# Patient Record
Sex: Female | Born: 1995 | Race: White | Hispanic: No | Marital: Single | Smoking: Never smoker
Health system: Southern US, Community
[De-identification: ages and names within clinical notes are randomized; demographics above are authoritative.]

## PROBLEM LIST (undated history)

## (undated) ENCOUNTER — Emergency Department (HOSPITAL_COMMUNITY): Payer: No Typology Code available for payment source | Source: Home / Self Care

## (undated) DIAGNOSIS — F909 Attention-deficit hyperactivity disorder, unspecified type: Secondary | ICD-10-CM

## (undated) DIAGNOSIS — Z68.41 Body mass index (BMI) pediatric, greater than or equal to 95th percentile for age: Secondary | ICD-10-CM

## (undated) DIAGNOSIS — E669 Obesity, unspecified: Secondary | ICD-10-CM

## (undated) HISTORY — DX: Attention-deficit hyperactivity disorder, unspecified type: F90.9

## (undated) HISTORY — PX: WISDOM TOOTH EXTRACTION: SHX21

## (undated) HISTORY — DX: Morbid (severe) obesity due to excess calories: E66.01

## (undated) HISTORY — DX: Obesity, unspecified: E66.9

## (undated) HISTORY — PX: TYMPANOSTOMY TUBE PLACEMENT: SHX32

## (undated) HISTORY — DX: Body mass index (bmi) pediatric, greater than or equal to 95th percentile for age: Z68.54

---
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
## 2016-04-25 ENCOUNTER — Ambulatory Visit: Payer: Self-pay | Admitting: Neurology

## 2016-06-06 ENCOUNTER — Encounter: Payer: Self-pay | Admitting: Neurology

## 2016-06-06 ENCOUNTER — Ambulatory Visit (INDEPENDENT_AMBULATORY_CARE_PROVIDER_SITE_OTHER): Payer: 59 | Admitting: Neurology

## 2016-06-06 DIAGNOSIS — F902 Attention-deficit hyperactivity disorder, combined type: Secondary | ICD-10-CM

## 2016-06-06 DIAGNOSIS — F909 Attention-deficit hyperactivity disorder, unspecified type: Secondary | ICD-10-CM

## 2016-06-06 HISTORY — DX: Attention-deficit hyperactivity disorder, unspecified type: F90.9

## 2016-06-06 MED ORDER — ATOMOXETINE HCL 25 MG PO CAPS: 25.0000 mg | ORAL_CAPSULE | Freq: Every day | ORAL | 0 refills | Status: DC

## 2016-06-06 NOTE — Progress Notes (Signed)
Reason for visit: ADHD  Referring physician: Dr. [name] is a 21 y.o. female  History of present illness:  Leslie Watkins is a 21 year old right-handed white female with a lifelong history of ADHD. The patient has been treated for this issue since she was age 32. The patient was noted to have extreme hyperactivity at that time, and difficulty focusing. She has been on several different medications in the past that include Ritalin, Adderall, and Concerta. The patient was on Concerta for several years. She took the medication when she studied abroad in 2016, but she did not find the medication useful. She will be graduating in December 2018, she is taking a lot of very difficult classes between now and then. She denies any other symptoms such as headache, daytime drowsiness, insomnia, numbness or weakness of the extremities with exception that her right hand may have some numbness at night. The patient denies any balance issues or difficulty controlling the bowels or the bladder. She indicates that her brother also has ADHD. She is sent to this office for an evaluation. The patient denies any underlying depression or anxiety issues.  Past Medical History:  Diagnosis Date  . ADHD   . ADHD (attention deficit hyperactivity disorder) 06/06/2016  . Obesity peds (BMI >=95 percentile)     Past Surgical History:  Procedure Laterality Date  . TYMPANOSTOMY TUBE PLACEMENT    . WISDOM TOOTH EXTRACTION      Family History  Problem Relation Age of Onset  . High blood pressure Brother   . ADD / ADHD Brother   . High blood pressure Maternal Grandmother   . High blood pressure Maternal Grandfather   . High Cholesterol Paternal Grandmother   . High Cholesterol Paternal Grandfather     Social history:  reports that she has been smoking.  She has never used smokeless tobacco. She reports that she drinks about 0.6 oz of alcohol per week . She reports that she does not use  drugs.  Medications:  Prior to Admission medications   Medication Sig Start Date End Date Taking? Authorizing Provider  Ascorbic Acid (VITAMIN C PO) Take by mouth daily.   Yes Historical Provider, MD  BIOTIN PO Take by mouth daily.   Yes Historical Provider, MD  atomoxetine (STRATTERA) 25 MG capsule Take 1 capsule (25 mg total) by mouth daily. 06/06/16   York Spaniel, MD     No Known Allergies  ROS:  Out of a complete 14 system review of symptoms, the patient complains only of the following symptoms, and all other reviewed systems are negative.  Hyperactivity  Blood pressure 128/84, pulse 68, height 5' 5.5" (1.664 m), weight 267 lb (121.1 kg).  Physical Exam  General: The patient is alert and cooperative at the time of the examination. The patient is morbidly obese.  Eyes: Pupils are equal, round, and reactive to light. Discs are flat bilaterally.  Neck: The neck is supple, no carotid bruits are noted.  Respiratory: The respiratory examination is clear.  Cardiovascular: The cardiovascular examination reveals a regular rate and rhythm, no obvious murmurs or rubs are noted.  Skin: Extremities are without significant edema.  Neurologic Exam  Mental status: The patient is alert and oriented x 3 at the time of the examination. The patient has apparent normal recent and remote memory, with an apparently normal attention span and concentration ability.  Cranial nerves: Facial symmetry is present. There is good sensation of the face to pinprick and soft  touch bilaterally. The strength of the facial muscles and the muscles to head turning and shoulder shrug are normal bilaterally. Speech is well enunciated, no aphasia or dysarthria is noted. Extraocular movements are full. Visual fields are full. The tongue is midline, and the patient has symmetric elevation of the soft palate. No obvious hearing deficits are noted.  Motor: The motor testing reveals 5 over 5 strength of all 4  extremities. Good symmetric motor tone is noted throughout.  Sensory: Sensory testing is intact to pinprick, soft touch, vibration sensation, and position sense on all 4 extremities. No evidence of extinction is noted.  Coordination: Cerebellar testing reveals good finger-nose-finger and heel-to-shin bilaterally.  Gait and station: Gait is normal. Tandem gait is normal. Romberg is negative. No drift is seen.  Reflexes: Deep tendon reflexes are symmetric and normal bilaterally. Toes are downgoing bilaterally.   Assessment/Plan:  1. ADHD  The patient will be given a trial on low-dose Strattera, if this is not effective, she is to call for dose adjustments. She will otherwise follow-up in 6 months.   [facility] is [phone].  Marlan Palau. Keith Mendell Bontempo MD 06/06/2016 2:33 PM  [facility] [address] [place], [place] [id number]  Phone [phone] Fax [phone]

## 2016-12-13 ENCOUNTER — Encounter (INDEPENDENT_AMBULATORY_CARE_PROVIDER_SITE_OTHER): Payer: Self-pay

## 2016-12-13 ENCOUNTER — Encounter: Payer: Self-pay | Admitting: Adult Health

## 2016-12-13 ENCOUNTER — Ambulatory Visit (INDEPENDENT_AMBULATORY_CARE_PROVIDER_SITE_OTHER): Payer: 59 | Admitting: Adult Health

## 2016-12-13 VITALS — BP 112/66 | HR 72 | Ht 65.5 in | Wt 277.8 lb

## 2016-12-13 DIAGNOSIS — F902 Attention-deficit hyperactivity disorder, combined type: Secondary | ICD-10-CM | POA: Diagnosis not present

## 2016-12-13 MED ORDER — LISDEXAMFETAMINE DIMESYLATE 20 MG PO CAPS: 20.0000 mg | ORAL_CAPSULE | Freq: Every day | ORAL | 0 refills | Status: AC

## 2016-12-13 NOTE — Progress Notes (Signed)
PATIENT: Leslie Watkins DOB: Apr 14, 1996  REASON FOR VISIT: follow up HISTORY FROM: patient  HISTORY OF PRESENT ILLNESS: Today 12/13/16 Leslie Watkins is a 21 year old female with a history of ADHD. She returns today for follow-up. At the last visit Strattera was started however the patient states that she was unable to tolerate this she reports that it allowed her to focus for approximately 30-40 minutes then it caused extreme drowsiness. She reports that she was unable to stay awake. She states that reason she stopped taking the medication. She reports that she was worried that she will fall asleep during class. She states that her ADHD was controlled without medication until she began college. She states that she has a hard time studying especially for her harder classes. She states this last semester she made decent grades- she reports that she received 2 As, 1 B, and 2 Cs. She states in the past she has gotten the most benefit from stimulants. However she is concerned about the potential for addiction with some of the stimulants. She states that she plans to not use medication once she finishes school.  HISTORY 06/06/16: Leslie Watkins is a 21 year old right-handed white female with a lifelong history of ADHD. The patient has been treated for this issue since she was age 59. The patient was noted to have extreme hyperactivity at that time, and difficulty focusing. She has been on several different medications in the past that include Ritalin, Adderall, and Concerta. The patient was on Concerta for several years. She took the medication when she studied abroad in 2016, but she did not find the medication useful. She will be graduating in December 2018, she is taking a lot of very difficult classes between now and then. She denies any other symptoms such as headache, daytime drowsiness, insomnia, numbness or weakness of the extremities with exception that her right hand may have some numbness at night.  The patient denies any balance issues or difficulty controlling the bowels or the bladder. She indicates that her brother also has ADHD. She is sent to this office for an evaluation. The patient denies any underlying depression or anxiety issues.   REVIEW OF SYSTEMS: Out of a complete 14 system review of symptoms, the patient complains only of the following symptoms, and all other reviewed systems are negative.  See history of present illness  ALLERGIES: Allergies  Allergen Reactions  . No Known Allergies     HOME MEDICATIONS: Outpatient Medications Prior to Visit  Medication Sig Dispense Refill  . Ascorbic Acid (VITAMIN C PO) Take by mouth daily.    Marland Kitchen atomoxetine (STRATTERA) 25 MG capsule Take 1 capsule (25 mg total) by mouth daily. 30 capsule 0  . BIOTIN PO Take by mouth daily.     No facility-administered medications prior to visit.     PAST MEDICAL HISTORY: Past Medical History:  Diagnosis Date  . ADHD   . ADHD (attention deficit hyperactivity disorder) 06/06/2016  . Morbidly obese (HCC)   . Obesity peds (BMI >=95 percentile)     PAST SURGICAL HISTORY: Past Surgical History:  Procedure Laterality Date  . TYMPANOSTOMY TUBE PLACEMENT    . WISDOM TOOTH EXTRACTION      FAMILY HISTORY: Family History  Problem Relation Age of Onset  . High blood pressure Brother   . ADD / ADHD Brother   . High blood pressure Maternal Grandmother   . High blood pressure Maternal Grandfather   . High Cholesterol Paternal Grandmother   . High  Cholesterol Paternal Grandfather     SOCIAL HISTORY: Social History   Social History  . Marital status: Single    Spouse name: N/A  . Number of children: 0  . Years of education: Associate's deg   Occupational History  . [employer]    Social History Main Topics  . Smoking status: Never Smoker  . Smokeless tobacco: Never Used     Comment: Hookah 1-2 x/mo  . Alcohol use 2.4 oz/week    1 Standard drinks or equivalent, 1 Glasses  of wine, 1 Cans of beer, 1 Shots of liquor per week     Comment: 2-3 drinks per month  . Drug use: No  . Sexual activity: Not Currently    Birth control/ protection: IUD   Other Topics Concern  . Not on file   Social History Narrative   Lives 1/2 time w/ family and 1/2 time w/ roommates   Right-handed   Caffeine: 5-7 per week      PHYSICAL EXAM  Vitals:   12/13/16 0720  BP: 112/66  Pulse: 72  Weight: 277 lb 12.8 oz (126 kg)  Height: 5' 5.5" (1.664 m)   Body mass index is 45.53 kg/m.  Generalized: Well developed, in no acute distress   Neurological examination  Mentation: Alert oriented to time, place, history taking. Follows all commands speech and language fluent Cranial nerve II-XII: Pupils were equal round reactive to light. Extraocular movements were full, visual field were full on confrontational test. Facial sensation and strength were normal. Uvula tongue midline. Head turning and shoulder shrug  were normal and symmetric. Motor: The motor testing reveals 5 over 5 strength of all 4 extremities. Good symmetric motor tone is noted throughout.  Sensory: Sensory testing is intact to soft touch on all 4 extremities. No evidence of extinction is noted.  Coordination: Cerebellar testing reveals good finger-nose-finger and heel-to-shin bilaterally.  Gait and station: Gait is normal. Tandem gait is normal. Romberg is negative. No drift is seen.  Reflexes: Deep tendon reflexes are symmetric and normal bilaterally.   DIAGNOSTIC DATA (LABS, IMAGING, TESTING) - I reviewed patient records, labs, notes, testing and imaging myself where available.    ASSESSMENT AND PLAN 21 y.o. year old female  has a past medical history of ADHD; ADHD (attention deficit hyperactivity disorder) (06/06/2016); Morbidly obese (HCC); and Obesity peds (BMI >=95 percentile). here with :  1. ADHD  The patient has tried several medications in the past. She feels that she gets the best benefit with  stimulants. We will try a low-dose Vyvanse. She will take 20 mg daily. I have reviewed side effects with the patient and she was given a handout. She is advised that if she is unable to tolerate the medication she should let us know. She will follow-up in 6 months or sooner if needed.  I spent 15 minutes with the patient. 50% of this time was spent reviewing the medication Vyvanse.     Butch PennyMegan Celia Friedland, MSN, NP-C 12/13/2016, 7:20 AM [facility] [address] [place], [place] [address] [phone]

## 2016-12-13 NOTE — Patient Instructions (Addendum)
Stop Strattera  Start Vyvanse 20 mg daily. Review Side effects below   Lisdexamfetamine Oral Capsule What is this medicine? LISDEXAMFETAMINE (lis DEX am fet a meen) is used to treat attention-deficit hyperactivity disorder (ADHD) in adults and children. It is also used to treat binge-eating disorder in adults. Federal law prohibits giving this medicine to any person other than the person for whom it was prescribed. Do not share this medicine with anyone else. This medicine may be used for other purposes; ask your health care provider or pharmacist if you have questions. COMMON BRAND NAME(S): Vyvanse What should I tell my health care provider before I take this medicine? They need to know if you have any of these conditions: -anxiety or panic attacks -circulation problems in fingers and toes -glaucoma -hardening or blockages of the arteries or heart blood vessels -heart disease or a heart defect -high blood pressure -history of a drug or alcohol abuse problem -history of stroke -kidney disease -liver disease -mental illness -seizures -suicidal thoughts, plans, or attempt; a previous suicide attempt by you or a family member -thyroid disease -Tourette's syndrome -an unusual or allergic reaction to lisdexamfetamine, other medicines, foods, dyes, or preservatives -pregnant or trying to get pregnant -breast-feeding How should I use this medicine? Take this medicine by mouth. Follow the directions on the prescription label. Swallow the capsules with a drink of water. You may open capsule and add to a glass of water, then drink right away. Take your doses at regular intervals. Do not take your medicine more often than directed. Do not suddenly stop your medicine. You must gradually reduce the dose or you may feel withdrawal effects. Ask your doctor or health care professional for advice. A special MedGuide will be given to you by the pharmacist with each prescription and refill. Be sure to  read this information carefully each time. Talk to your pediatrician regarding the use of this medicine in children. While this drug may be prescribed for children as young as 246 years of age for selected conditions, precautions do apply. Overdosage: If you think you have taken too much of this medicine contact a poison control center or emergency room at once. NOTE: This medicine is only for you. Do not share this medicine with others. What if I miss a dose? If you miss a dose, take it as soon as you can. If it is almost time for your next dose, take only that dose. Do not take double or extra doses. What may interact with this medicine? Do not take this medicine with any of the following medications: -MAOIs like Carbex, Eldepryl, Marplan, Nardil, and Parnate -other stimulant medicines for attention disorders, weight loss, or to stay awake This medicine may also interact with the following medications: -acetazolamide -ammonium chloride -antacids -ascorbic acid -atomoxetine -caffeine -certain medicines for blood pressure -certain medicines for depression, anxiety, or psychotic disturbances -certain medicines for seizures like carbamazepine, phenobarbital, phenytoin -certain medicines for stomach problems like cimetidine, famotidine, omeprazole, lansoprazole -cold or allergy medicines -green tea -levodopa -linezolid -medicines for sleep during surgery -methenamine -norepinephrine -phenothiazines like chlorpromazine, mesoridazine, prochlorperazine, thioridazine -propoxyphene -sodium acid phosphate -sodium bicarbonate This list may not describe all possible interactions. Give your health care provider a list of all the medicines, herbs, non-prescription drugs, or dietary supplements you use. Also tell them if you smoke, drink alcohol, or use illegal drugs. Some items may interact with your medicine. What should I watch for while using this medicine? Visit your doctor for regular check  ups. This prescription requires that you follow special procedures with your doctor and pharmacy. You will need to have a new written prescription from your doctor every time you need a refill. This medicine may affect your concentration, or hide signs of tiredness. Until you know how this medicine affects you, do not drive, ride a bicycle, use machinery, or do anything that needs mental alertness. Tell your doctor or health care professional if this medicine loses its effects, or if you feel you need to take more than the prescribed amount. Do not change your dose without talking to your doctor or health care professional. Decreased appetite is a common side effect when starting this medicine. Eating small, frequent meals or snacks can help. Talk to your doctor if you continue to have poor eating habits. Height and weight growth of a child taking this medicine will be monitored closely. Do not take this medicine close to bedtime. It may prevent you from sleeping. If you are going to need surgery, a MRI, CT scan, or other procedure, tell your doctor that you are taking this medicine. You may need to stop taking this medicine before the procedure. Tell your doctor or healthcare professional right away if you notice unexplained wounds on your fingers and toes while taking this medicine. You should also tell your healthcare provider if you experience numbness or pain, changes in the skin color, or sensitivity to temperature in your fingers or toes. What side effects may I notice from receiving this medicine? Side effects that you should report to your doctor or health care professional as soon as possible: -allergic reactions like skin rash, itching or hives, swelling of the face, lips, or tongue -changes in vision -chest pain or chest tightness -confusion, trouble speaking or understanding -fast, irregular heartbeat -fingers or toes feel numb, cool, painful -hallucination, loss of contact with  reality -high blood pressure -males: prolonged or painful erection -seizures -severe headaches -shortness of breath -suicidal thoughts or other mood changes -trouble walking, dizziness, loss of balance or coordination -uncontrollable head, mouth, neck, arm, or leg movements Side effects that usually do not require medical attention (report to your doctor or health care professional if they continue or are bothersome): -anxious -headache -loss of appetite -nausea, vomiting -trouble sleeping -weight loss This list may not describe all possible side effects. Call your doctor for medical advice about side effects. You may report side effects to FDA at 1-800-FDA-1088. Where should I keep my medicine? Keep out of the reach of children. This medicine can be abused. Keep your medicine in a safe place to protect it from theft. Do not share this medicine with anyone. Selling or giving away this medicine is dangerous and against the law. Store at room temperature between 15 and 30 degrees C (59 and 86 degrees F). Protect from light. Keep container tightly closed. Throw away any unused medicine after the expiration date. NOTE: This sheet is a summary. It may not cover all possible information. If you have questions about this medicine, talk to your doctor, pharmacist, or health care provider.  2018 [employer] (2014-02-26 19:20:14)

## 2016-12-13 NOTE — Progress Notes (Signed)
I have read the note, and I agree with the clinical assessment and plan.  Leslie Watkins,Leslie Watkins   

## 2017-06-15 ENCOUNTER — Ambulatory Visit: Payer: Self-pay | Admitting: Adult Health
